# Patient Record
Sex: Female | Born: 1935 | Race: White | Hispanic: No | Marital: Married
Health system: Southern US, Community
[De-identification: ages and names within clinical notes are randomized; demographics above are authoritative.]

---

## 2004-11-25 ENCOUNTER — Ambulatory Visit: Payer: Self-pay | Admitting: Internal Medicine

## 2006-07-15 ENCOUNTER — Ambulatory Visit: Payer: Self-pay | Admitting: Internal Medicine

## 2007-09-27 ENCOUNTER — Ambulatory Visit: Payer: Self-pay | Admitting: Internal Medicine

## 2008-09-20 ENCOUNTER — Ambulatory Visit: Payer: Self-pay | Admitting: Internal Medicine

## 2010-05-08 ENCOUNTER — Emergency Department: Payer: Self-pay | Admitting: Emergency Medicine

## 2010-10-01 ENCOUNTER — Ambulatory Visit: Payer: Self-pay | Admitting: Internal Medicine

## 2010-10-13 ENCOUNTER — Ambulatory Visit: Payer: Self-pay | Admitting: Internal Medicine

## 2011-08-20 ENCOUNTER — Ambulatory Visit: Payer: Self-pay | Admitting: Urology

## 2011-08-31 ENCOUNTER — Ambulatory Visit: Payer: Self-pay | Admitting: Internal Medicine

## 2011-08-31 LAB — COMPREHENSIVE METABOLIC PANEL
Albumin: 3.5 g/dL (ref 3.4–5.0)
BUN: 16 mg/dL (ref 7–18)
Bilirubin,Total: 0.2 mg/dL (ref 0.2–1.0)
Co2: 31 mmol/L (ref 21–32)
Creatinine: 1.2 mg/dL (ref 0.60–1.30)
Glucose: 99 mg/dL (ref 65–99)
Potassium: 4.4 mmol/L (ref 3.5–5.1)
SGOT(AST): 17 U/L (ref 15–37)
SGPT (ALT): 21 U/L
Total Protein: 8.6 g/dL — ABNORMAL HIGH (ref 6.4–8.2)

## 2011-08-31 LAB — CBC CANCER CENTER
Basophil #: 0 x10 3/mm (ref 0.0–0.1)
Basophil %: 0.3 %
Eosinophil #: 0.7 x10 3/mm (ref 0.0–0.7)
Eosinophil %: 8.1 %
HCT: 43.4 % (ref 35.0–47.0)
Lymphocyte #: 1.1 x10 3/mm (ref 1.0–3.6)
Lymphocyte %: 13.3 %
MCH: 26.8 pg (ref 26.0–34.0)
MCHC: 32.7 g/dL (ref 32.0–36.0)
MCV: 82 fL (ref 80–100)
Monocyte %: 6.5 %
Neutrophil #: 6 x10 3/mm (ref 1.4–6.5)
Neutrophil %: 71.8 %
RBC: 5.29 10*6/uL — ABNORMAL HIGH (ref 3.80–5.20)

## 2011-08-31 LAB — APTT: Activated PTT: 36.6 secs — ABNORMAL HIGH (ref 23.6–35.9)

## 2011-08-31 LAB — PROTIME-INR
INR: 0.8
Prothrombin Time: 11.7 secs (ref 11.5–14.7)

## 2011-09-03 ENCOUNTER — Ambulatory Visit: Payer: Self-pay | Admitting: Internal Medicine

## 2011-09-09 ENCOUNTER — Ambulatory Visit: Payer: Self-pay | Admitting: Surgery

## 2011-09-14 ENCOUNTER — Ambulatory Visit: Payer: Self-pay

## 2011-09-18 ENCOUNTER — Ambulatory Visit: Payer: Self-pay | Admitting: Internal Medicine

## 2011-09-21 ENCOUNTER — Ambulatory Visit: Payer: Self-pay | Admitting: Internal Medicine

## 2011-10-06 LAB — CBC CANCER CENTER
Bands: 3 %
Basophil #: 0 x10 3/mm (ref 0.0–0.1)
Eosinophil #: 0.4 x10 3/mm (ref 0.0–0.7)
HCT: 39.4 % (ref 35.0–47.0)
HGB: 12.9 g/dL (ref 12.0–16.0)
Lymphocyte %: 9.9 %
Lymphocytes: 9 %
MCHC: 32.7 g/dL (ref 32.0–36.0)
MCV: 82 fL (ref 80–100)
Metamyelocyte: 1 %
Monocyte %: 7.9 %
Monocytes: 7 %
Neutrophil %: 77.5 %
Platelet: 331 x10 3/mm (ref 150–440)
RDW: 18.4 % — ABNORMAL HIGH (ref 11.5–14.5)
Segmented Neutrophils: 74 %

## 2011-10-06 LAB — RETICULOCYTES
Absolute Retic Count: 0.071 10*6/uL (ref 0.024–0.084)
Reticulocyte: 1.5 % (ref 0.5–1.5)

## 2011-10-10 ENCOUNTER — Inpatient Hospital Stay: Payer: Self-pay | Admitting: Internal Medicine

## 2011-10-10 LAB — CBC
HGB: 13.7 g/dL (ref 12.0–16.0)
MCH: 27.1 pg (ref 26.0–34.0)
MCV: 82 fL (ref 80–100)
Platelet: 233 10*3/uL (ref 150–440)
RBC: 5.06 10*6/uL (ref 3.80–5.20)
WBC: 13.6 10*3/uL — ABNORMAL HIGH (ref 3.6–11.0)

## 2011-10-10 LAB — APTT: Activated PTT: 33.2 secs (ref 23.6–35.9)

## 2011-10-10 LAB — COMPREHENSIVE METABOLIC PANEL
Anion Gap: 16 (ref 7–16)
BUN: 38 mg/dL — ABNORMAL HIGH (ref 7–18)
Bilirubin,Total: 0.4 mg/dL (ref 0.2–1.0)
Chloride: 108 mmol/L — ABNORMAL HIGH (ref 98–107)
Creatinine: 1.35 mg/dL — ABNORMAL HIGH (ref 0.60–1.30)
EGFR (African American): 49 — ABNORMAL LOW
Osmolality: 296 (ref 275–301)
Potassium: 4.7 mmol/L (ref 3.5–5.1)
Sodium: 144 mmol/L (ref 136–145)
Total Protein: 8.5 g/dL — ABNORMAL HIGH (ref 6.4–8.2)

## 2011-10-10 LAB — PROTIME-INR: Prothrombin Time: 14.8 secs — ABNORMAL HIGH (ref 11.5–14.7)

## 2011-10-11 LAB — TSH: Thyroid Stimulating Horm: 0.187 u[IU]/mL — ABNORMAL LOW

## 2011-10-12 LAB — BASIC METABOLIC PANEL
BUN: 40 mg/dL — ABNORMAL HIGH (ref 7–18)
Chloride: 112 mmol/L — ABNORMAL HIGH (ref 98–107)
Co2: 21 mmol/L (ref 21–32)
Creatinine: 1.3 mg/dL (ref 0.60–1.30)
EGFR (African American): 51 — ABNORMAL LOW
EGFR (Non-African Amer.): 42 — ABNORMAL LOW
Potassium: 4.5 mmol/L (ref 3.5–5.1)
Sodium: 142 mmol/L (ref 136–145)

## 2011-10-13 LAB — CBC WITH DIFFERENTIAL/PLATELET
Basophil #: 0 10*3/uL (ref 0.0–0.1)
Basophil %: 0 %
HCT: 37.4 % (ref 35.0–47.0)
HGB: 11.9 g/dL — ABNORMAL LOW (ref 12.0–16.0)
Lymphocyte %: 4.2 %
MCH: 26.4 pg (ref 26.0–34.0)
MCHC: 31.8 g/dL — ABNORMAL LOW (ref 32.0–36.0)
MCV: 83 fL (ref 80–100)
Monocyte %: 6.4 %
Neutrophil %: 87.7 %
Platelet: 235 10*3/uL (ref 150–440)
RBC: 4.5 10*6/uL (ref 3.80–5.20)
RDW: 19.2 % — ABNORMAL HIGH (ref 11.5–14.5)

## 2011-10-13 LAB — BASIC METABOLIC PANEL
Anion Gap: 14 (ref 7–16)
BUN: 41 mg/dL — ABNORMAL HIGH (ref 7–18)
Calcium, Total: 10.6 mg/dL — ABNORMAL HIGH (ref 8.5–10.1)
Chloride: 114 mmol/L — ABNORMAL HIGH (ref 98–107)
Co2: 17 mmol/L — ABNORMAL LOW (ref 21–32)
Creatinine: 1.17 mg/dL (ref 0.60–1.30)
EGFR (African American): 57 — ABNORMAL LOW
EGFR (Non-African Amer.): 47 — ABNORMAL LOW
Glucose: 104 mg/dL — ABNORMAL HIGH (ref 65–99)
Osmolality: 299 (ref 275–301)
Sodium: 145 mmol/L (ref 136–145)

## 2011-10-14 LAB — BASIC METABOLIC PANEL
BUN: 39 mg/dL — ABNORMAL HIGH (ref 7–18)
Calcium, Total: 10.1 mg/dL (ref 8.5–10.1)
Co2: 17 mmol/L — ABNORMAL LOW (ref 21–32)
Creatinine: 1.1 mg/dL (ref 0.60–1.30)
EGFR (African American): >60
Glucose: 85 mg/dL (ref 65–99)
Potassium: 4.4 mmol/L (ref 3.5–5.1)
Sodium: 141 mmol/L (ref 136–145)

## 2011-10-15 LAB — BASIC METABOLIC PANEL
Anion Gap: 12 (ref 7–16)
Calcium, Total: 9.2 mg/dL (ref 8.5–10.1)
Co2: 19 mmol/L — ABNORMAL LOW (ref 21–32)
Creatinine: 1.08 mg/dL (ref 0.60–1.30)
EGFR (Non-African Amer.): 52 — ABNORMAL LOW
Glucose: 86 mg/dL (ref 65–99)
Osmolality: 291 (ref 275–301)
Potassium: 4.2 mmol/L (ref 3.5–5.1)
Sodium: 142 mmol/L (ref 136–145)

## 2011-10-19 ENCOUNTER — Ambulatory Visit: Payer: Self-pay | Admitting: Internal Medicine

## 2011-10-19 LAB — PROT IMMUNOELECT,UR-24HR

## 2011-10-19 LAB — PROT IMMUNOELECTROPHORES(ARMC)

## 2011-10-26 LAB — PROTIME-INR: INR: 1.5

## 2011-10-29 LAB — PROTIME-INR
INR: 2.3
Prothrombin Time: 25.7 secs — ABNORMAL HIGH (ref 11.5–14.7)

## 2011-11-02 LAB — PROTIME-INR: Prothrombin Time: 40.3 secs — ABNORMAL HIGH (ref 11.5–14.7)

## 2011-11-09 LAB — PROTIME-INR: Prothrombin Time: 20.7 secs — ABNORMAL HIGH (ref 11.5–14.7)

## 2011-11-10 LAB — CBC CANCER CENTER
Basophil #: 0.1 x10 3/mm (ref 0.0–0.1)
Eosinophil %: 4.6 %
HCT: 32.7 % — ABNORMAL LOW (ref 35.0–47.0)
Lymphocyte %: 6 %
MCV: 79 fL — ABNORMAL LOW (ref 80–100)
Platelet: 460 x10 3/mm — ABNORMAL HIGH (ref 150–440)
RBC: 4.12 10*6/uL (ref 3.80–5.20)

## 2011-11-10 LAB — BASIC METABOLIC PANEL
Anion Gap: 16 (ref 7–16)
BUN: 23 mg/dL — ABNORMAL HIGH (ref 7–18)
Chloride: 105 mmol/L (ref 98–107)
Creatinine: 1.85 mg/dL — ABNORMAL HIGH (ref 0.60–1.30)
EGFR (African American): 29 — ABNORMAL LOW
EGFR (Non-African Amer.): 25 — ABNORMAL LOW
Glucose: 124 mg/dL — ABNORMAL HIGH (ref 65–99)
Osmolality: 286 (ref 275–301)
Potassium: 4.4 mmol/L (ref 3.5–5.1)
Sodium: 141 mmol/L (ref 136–145)

## 2011-11-10 LAB — PROTIME-INR
INR: 1.6
Prothrombin Time: 19.4 secs — ABNORMAL HIGH (ref 11.5–14.7)

## 2011-11-12 LAB — PROTIME-INR: INR: 1.4

## 2011-11-16 LAB — PROTIME-INR: Prothrombin Time: 20.4 secs — ABNORMAL HIGH (ref 11.5–14.7)

## 2011-11-18 ENCOUNTER — Ambulatory Visit: Payer: Self-pay | Admitting: Internal Medicine

## 2012-01-18 DEATH — deceased

## 2013-10-27 IMAGING — NM NUCLEAR MEDICINE CARDIAC MULTIPLE UPTAKE GATED ACQUISITION SCAN
4 series · 24 of 24 positions shown · non-contrast
Comparison: None

REASON FOR EXAM: LVEF function for pre chemo and lymphoma
COMMENTS:

PROCEDURE:     KNM - KNM REST MUGA SCAN [DATE] OF [DATE]  - [DATE] [DATE] [DATE] [DATE]
RESULT:     History: Lymphoma
TECHNIQUE: Patient's RBCs were labeled in vitro with 24.49 mCi of Tc 99m
pertechnetate using the standard kit. The patient's RBCs were then injected
and gated images in the LAO projection were obtained of the heart at rest.
Gated equilibrium data was used to calculate the left ventricular ejection
fraction. Images were evaluated in cine mode.

[Series 1000: lao 45-gated · 3.30mm/px · 6 of 24 frames shown]
[frame 3/24]
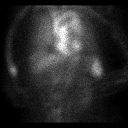
[frame 7/24]
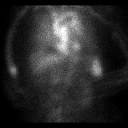
[frame 11/24]
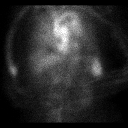
[frame 15/24]
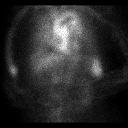
[frame 19/24]
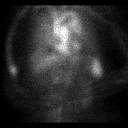
[frame 23/24]
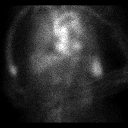

[Series 1000: lao 70-gated · 3.30mm/px · 6 of 24 frames shown]
[frame 3/24]
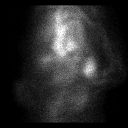
[frame 7/24]
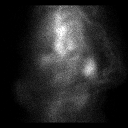
[frame 11/24]
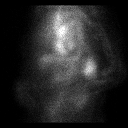
[frame 15/24]
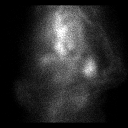
[frame 19/24]
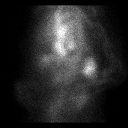
[frame 23/24]
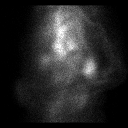

[Series 1000: lao 45-gated (results) · 3.30mm/px · 6 of 24 frames shown]
[frame 3/24]
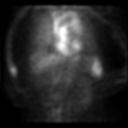
[frame 7/24]
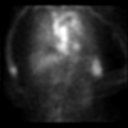
[frame 11/24]
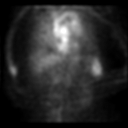
[frame 15/24]
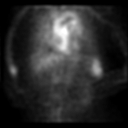
[frame 19/24]
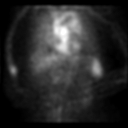
[frame 23/24]
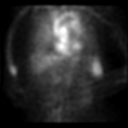

[Series 1000: ant-gated · 3.30mm/px · 6 of 24 frames shown]
[frame 3/24]
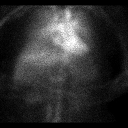
[frame 7/24]
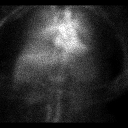
[frame 11/24]
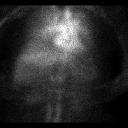
[frame 15/24]
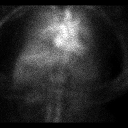
[frame 19/24]
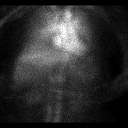
[frame 23/24]
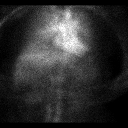

[24 of 24 positions shown; findings below may reference images not displayed]

FINDINGS: Calculated left ventricular ejection fraction is 64%. There is no regional
wall motion abnormality seen on the cine mode images.
IMPRESSION: 1. Left ventricular ejection fraction of 64%.

2. Normal cardiac chamber size and wall motion.

## 2013-10-30 IMAGING — US US EXTREM LOW VENOUS*L*
1 series · 17 of 23 positions shown · non-contrast
Comparison: none

REASON FOR EXAM: left leg swelling, cancer patient
COMMENTS:

PROCEDURE:     US  - US DOPPLER LOW EXTR LEFT  - October 10, 2011  [DATE]
RESULT:     There is occlusive thrombus in the common femoral vein,
superficial femoral vein, and the popliteal veins. There is some thrombus
noted also in the greater saphenous vein.

[Series 1: us extrem low venous*left* · 17 of 23 slices shown]
[im 1/23]
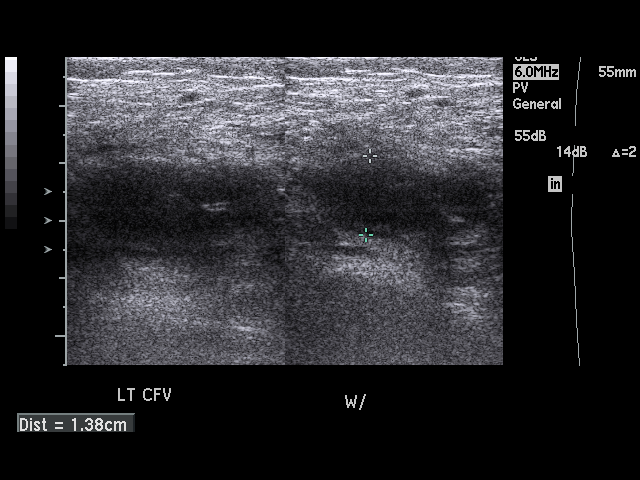
[im 3/23]
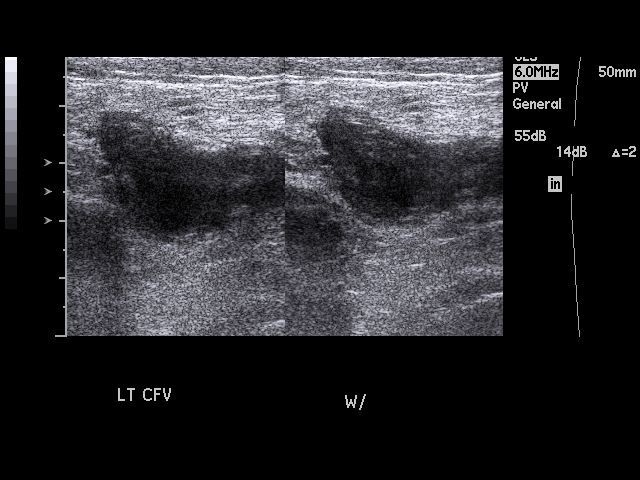
[im 4/23]
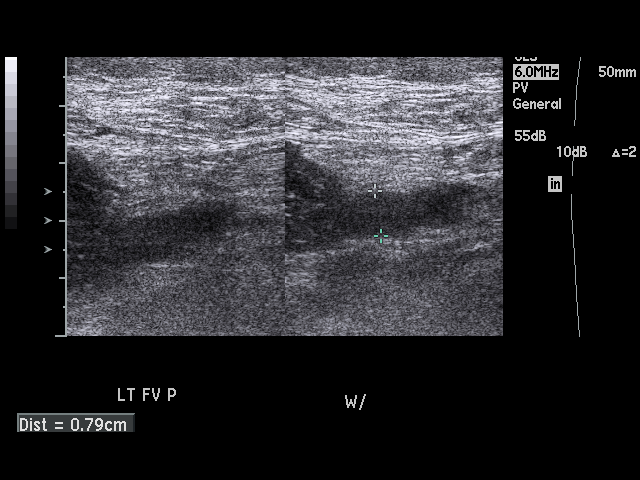
[im 5/23]
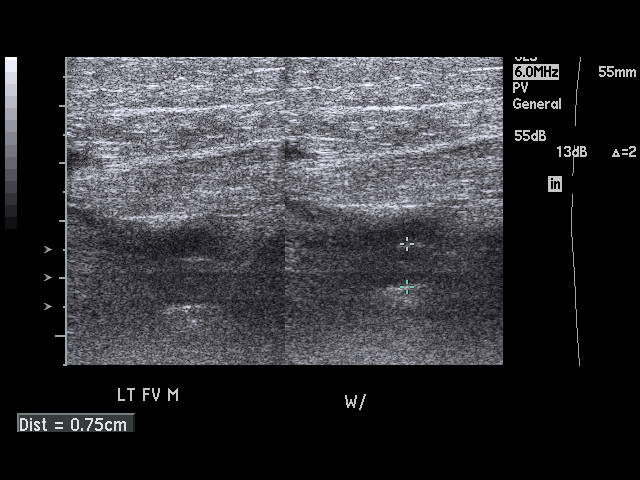
[im 7/23]
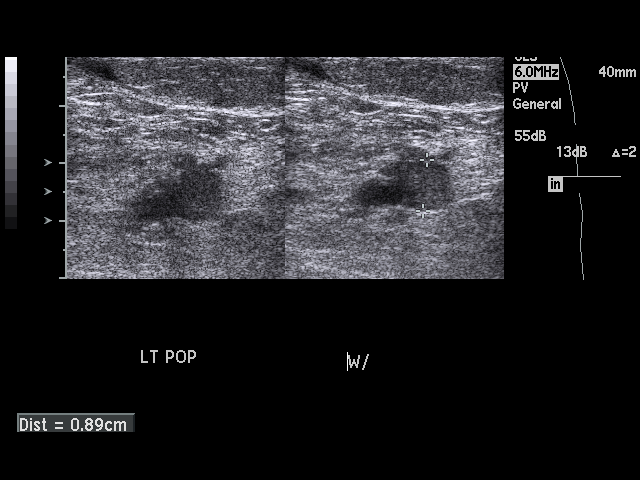
[im 8/23]
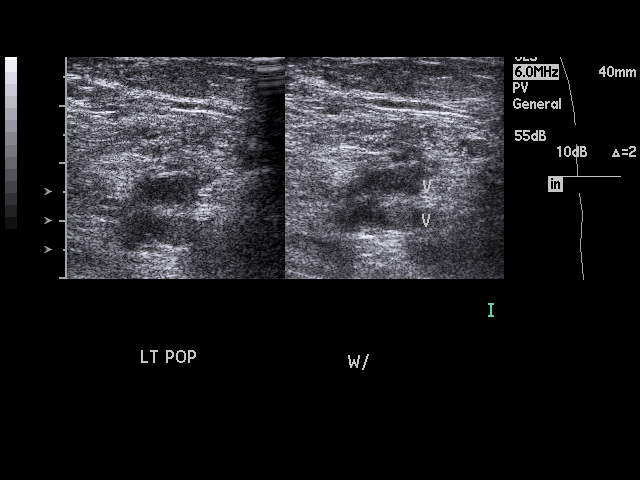
[im 9/23]
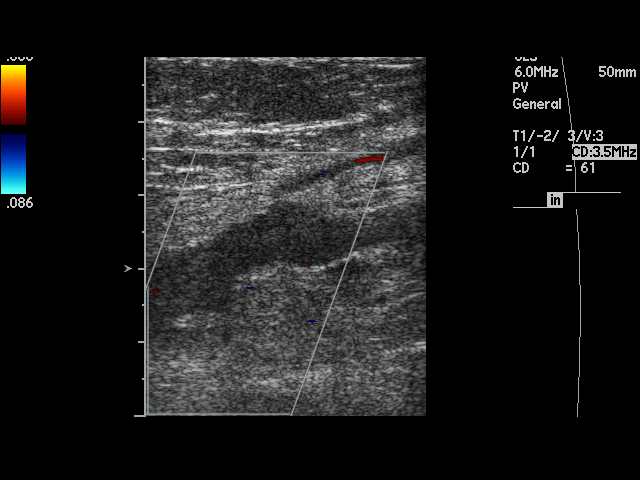
[im 11/23]
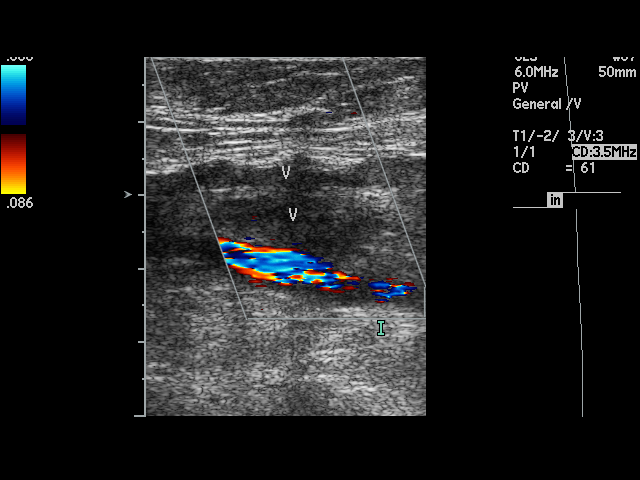
[im 12/23]
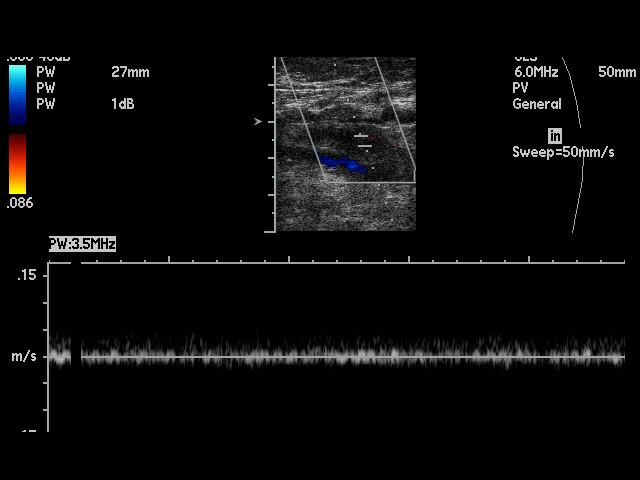
[im 13/23]
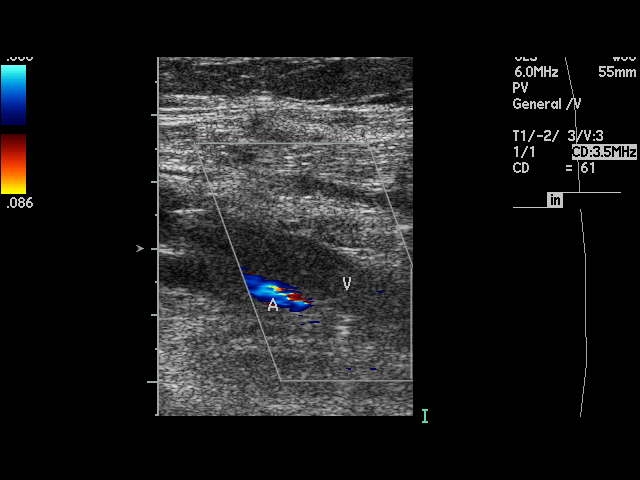
[im 15/23]
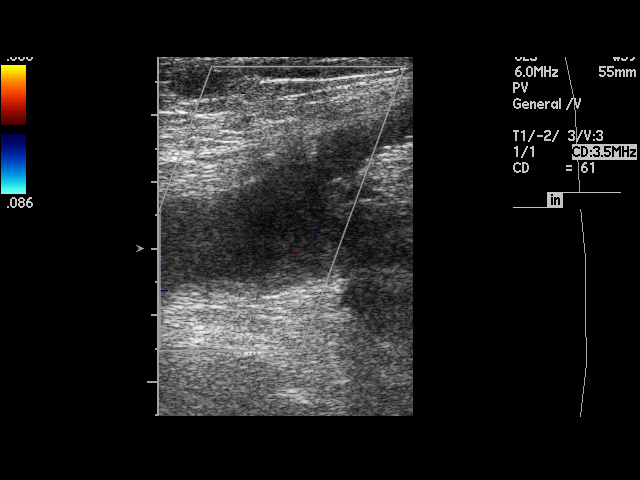
[im 16/23]
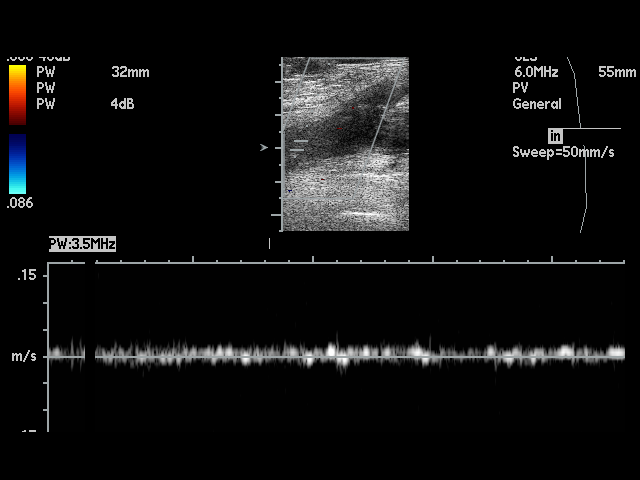
[im 17/23]
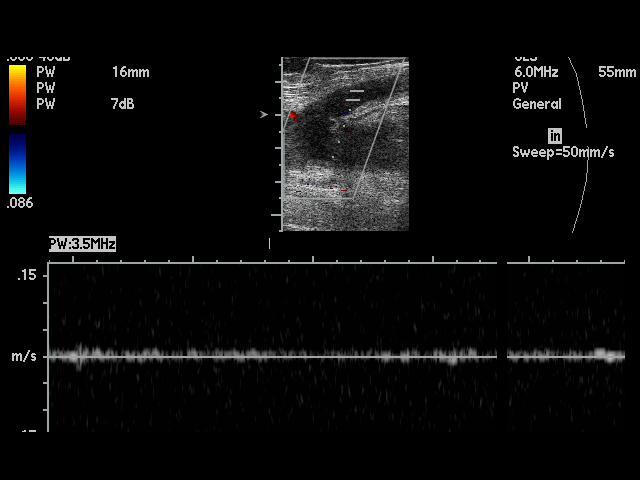
[im 19/23]
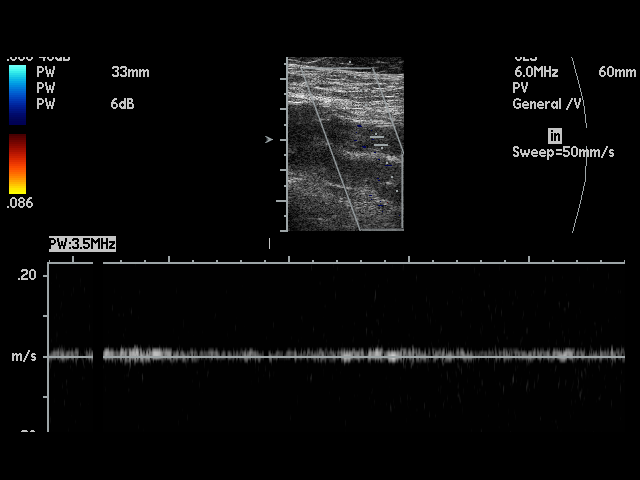
[im 20/23]
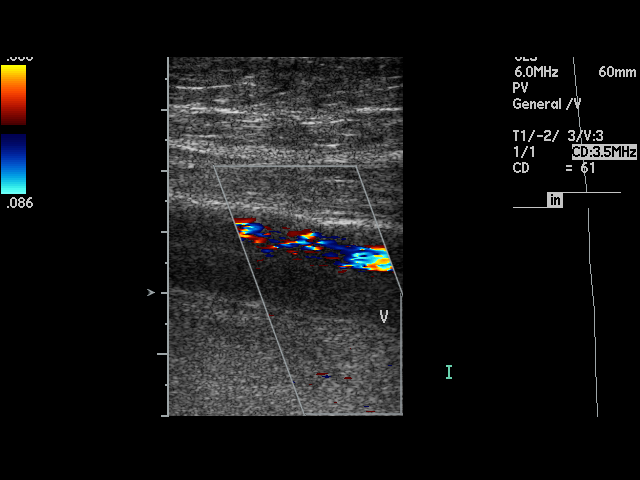
[im 21/23]
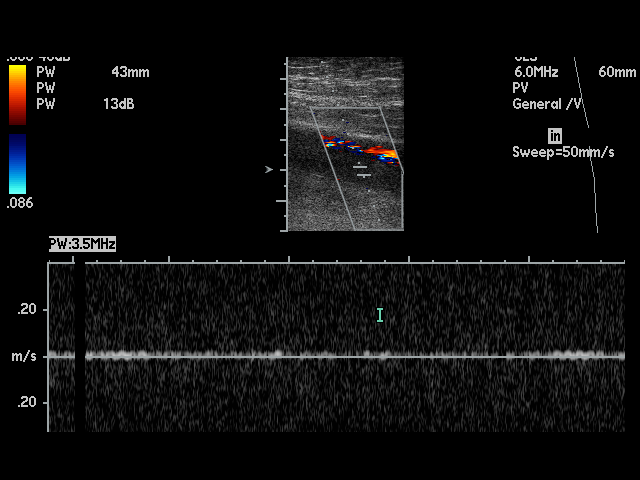
[im 23/23]
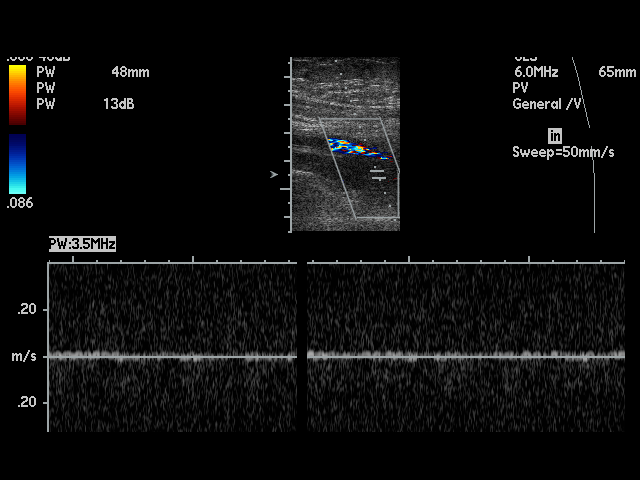

[17 of 23 positions shown; findings below may reference images not displayed]

IMPRESSION: There is acute occlusive thrombus within the left common
femoral, superficial femoral, and popliteal veins. There is a small amount
in the greater saphenous vein as well.

## 2013-11-01 IMAGING — CR METASTATIC BONE SURVEY
1 series · 8 of 8 positions shown · non-contrast
Comparison: none

REASON FOR EXAM: myeloma, eval for lytic bone lesions
COMMENTS:

[Series 1: w chest pa · 0.14mm/px · 8 of 23 slices shown]
[im 1/23]
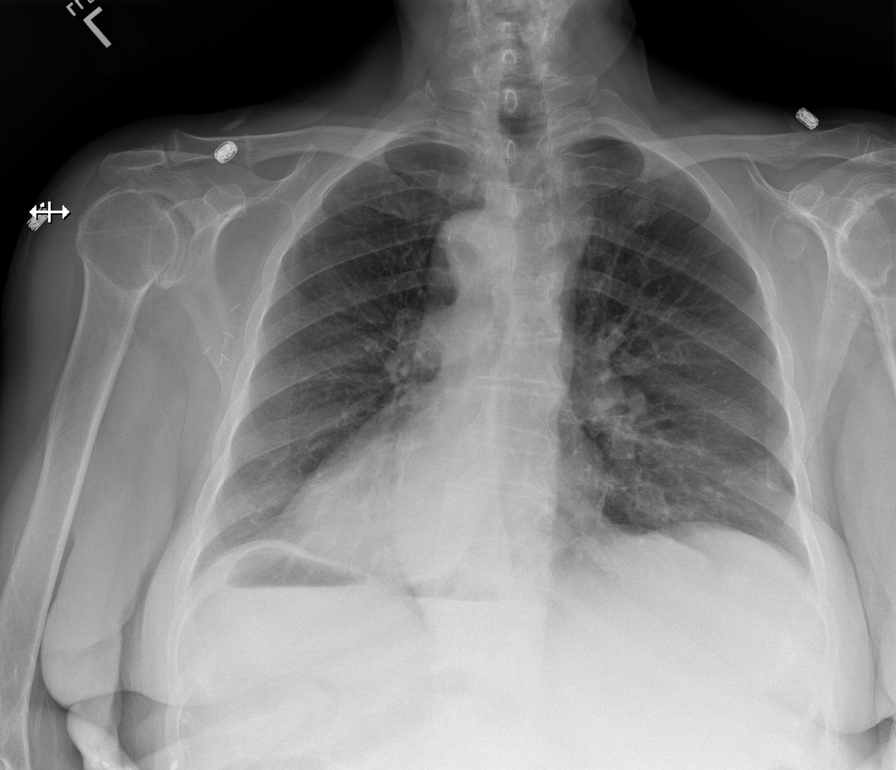
[im 4/23]
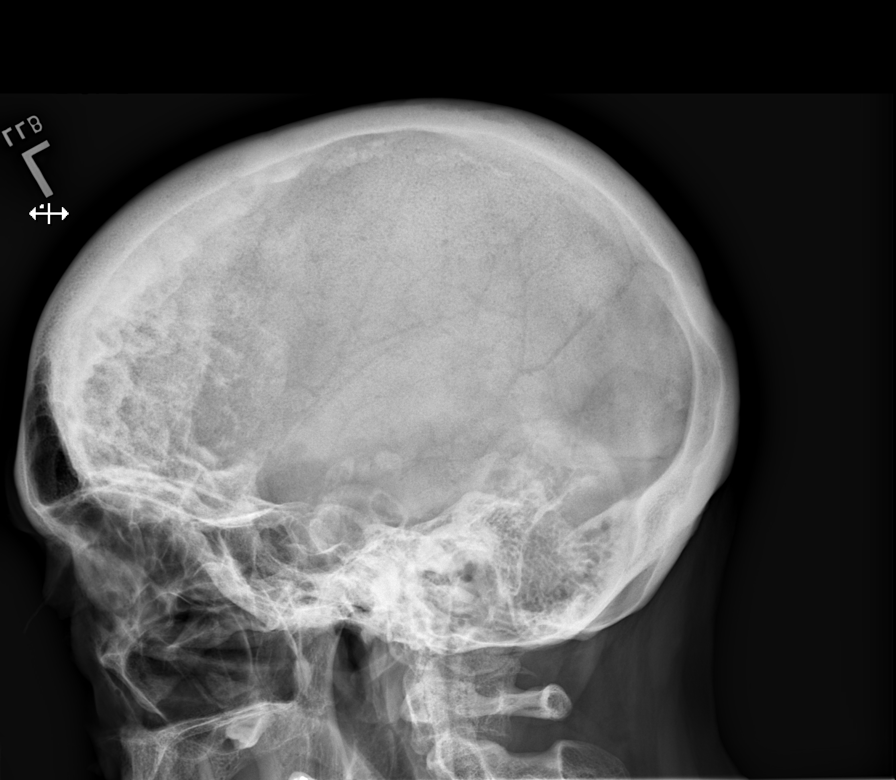
[im 7/23]
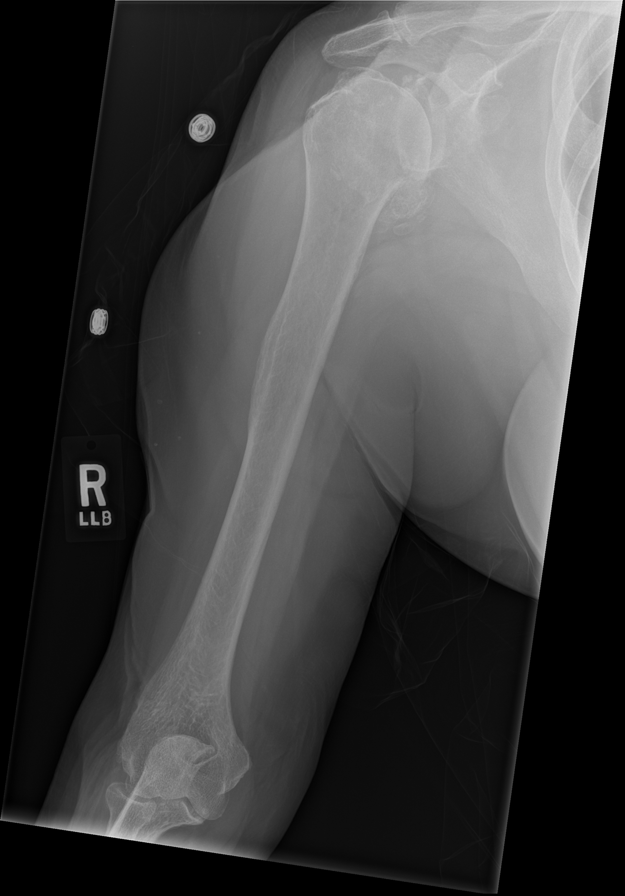
[im 10/23]
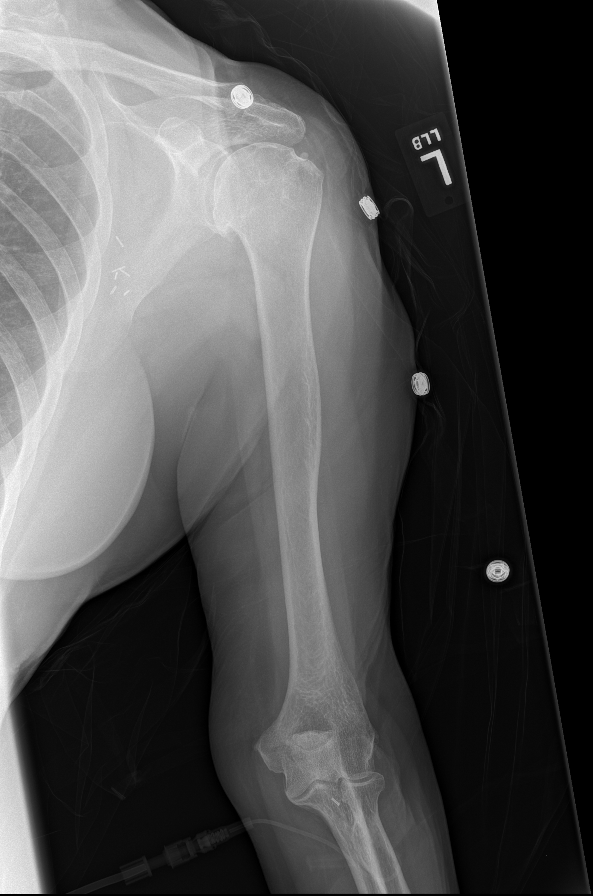
[im 13/23]
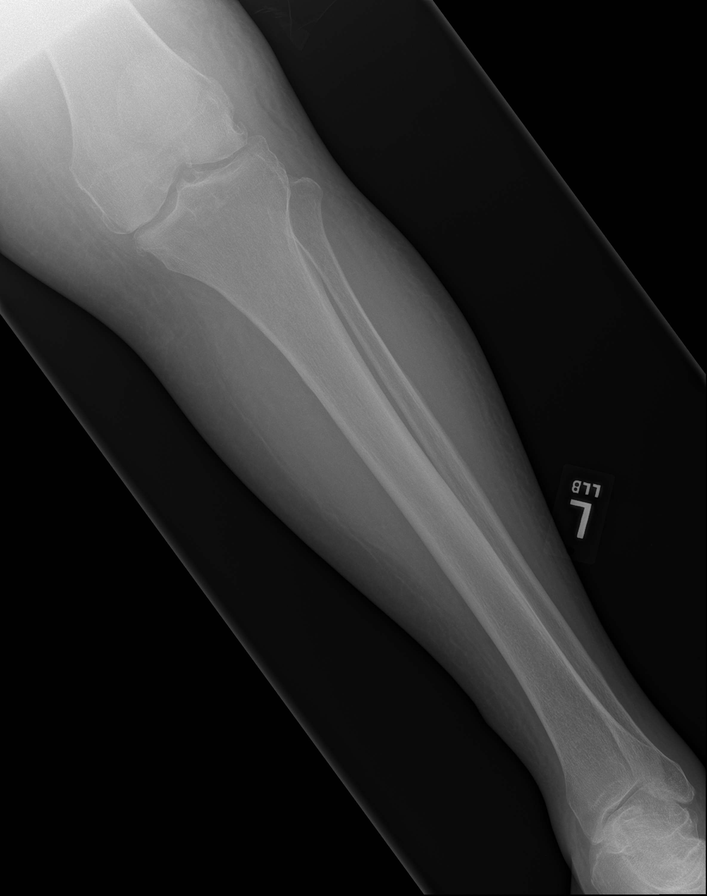
[im 16/23]
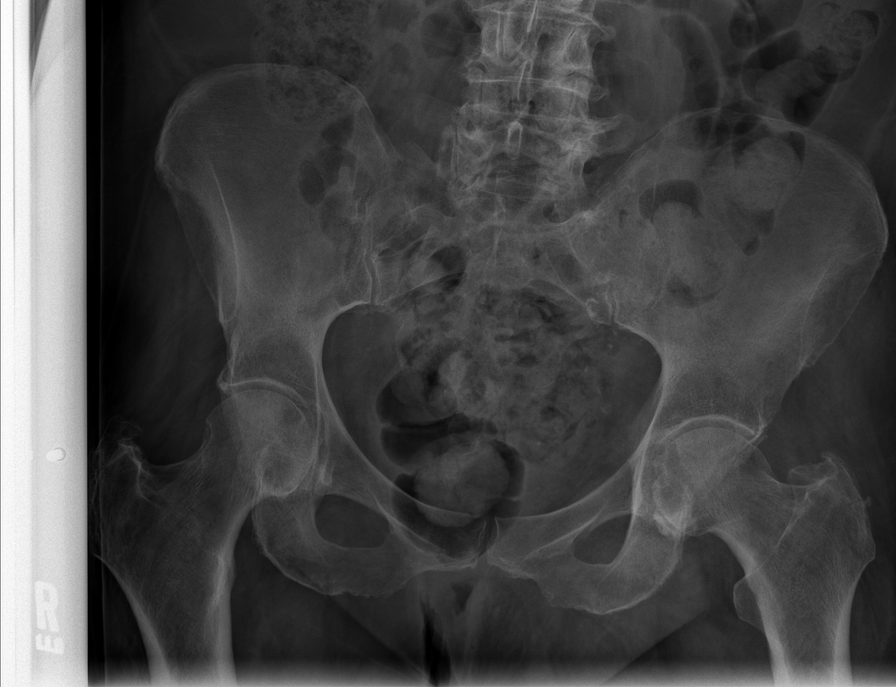
[im 19/23]
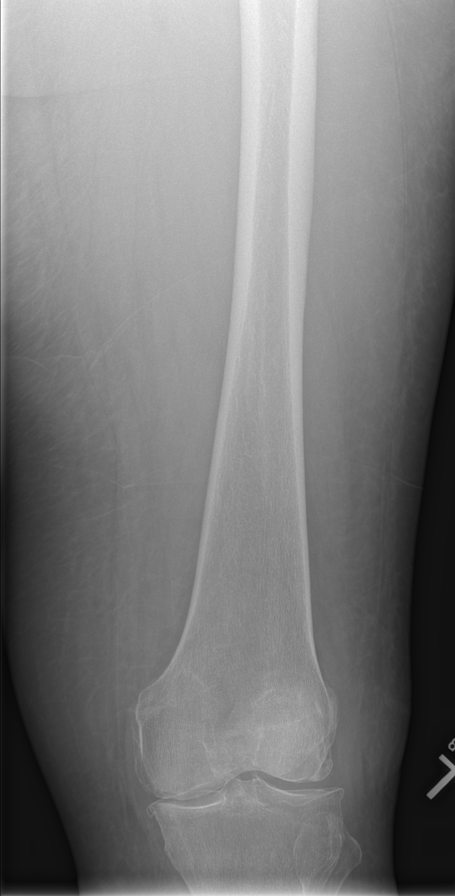
[im 23/23]
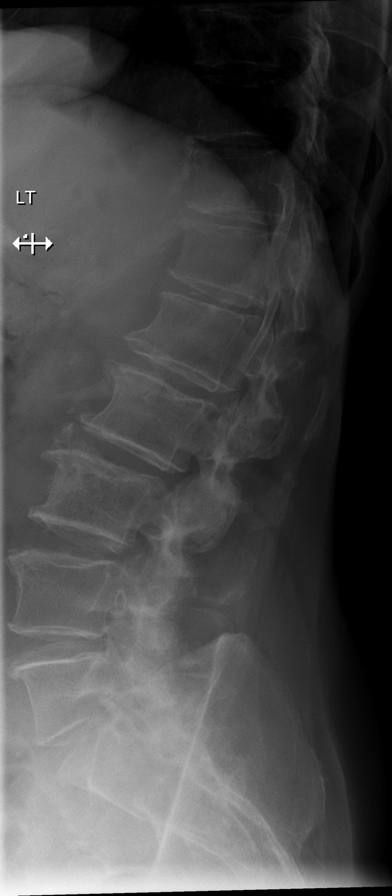

[8 of 8 positions shown; findings below may reference images not displayed]

PROCEDURE:     DXR - DXR BONE SURVEY METASTATIC  - October 12, 2011  [DATE]

RESULT:     No lytic or blastic lesions suspicious for metastatic disease
are identified. There are degenerative changes of the cervical spine. There
are noted mild, old-appearing vertebral compression deformities at
approximately T7 and T8.
IMPRESSION: 1.     No lytic or blastic lesions are identified.

## 2014-11-11 NOTE — Consult Note (Signed)
ONCOLOGY followup note - resting in bed, no new complaints. No new pains.no fevers. No bleeding Sxs.alert, NAD.            vitals - afebrile, stable           lungs - b/l good air entry           abd - soft, NTCr 1.08, calcium 9.2  Impression/Recommendations:81 year old female patient with multiple medical problems, recently diagnosed with stage III classical Hodgkin's disease alongwith concomitant diagnosis of multiple myeloma (bone marrow biopsy done for staging of Hodgkins disease incidentally found 50% monoclonal plasma cells consistent with multiple myeloma). Hypercalcemia secondary to malignancy, calcium and Creatinine doing well after IV hydration and Zometa 3 mg IV 3/26. Skeletal X-ray negative for lytic lesions. SIEP, 24-hour UIEP and PFT report pending. At this time, the patient and her cildren (son and 2 daughters) have decided not to put her through chemotherapy or cancer treatments, and want supportive care only including continuing zometa for control of hypercalcemia. She is being discharged back to [place] with [facility]. Newly diagnosed LLE DVT - patient being discharged on Xarelto. f/u at cancer center in about 3-4 weeks and plan next dose of Zometa. The patient and daughter present are agreeable to this plan.  Electronic Signatures: Jonn Shingles (MD)  (Signed on 29-Mar-13 00:02)  Authored  Last Updated: 29-Mar-13 00:02 by Jonn Shingles (MD)

## 2014-11-11 NOTE — Consult Note (Signed)
ONCOLOGY followup - resting in bed, weakness same. No new pains.no fevers. No bleeding Sxs.alert, NAD.            vitals - afebrile, stable           lungs - b/l good air entry           abd - soft, NTCr 1.10, calcium 10.1  Impression/Recommendations:81 year old female patient with multiple medical problems who has been recently diagnosed with stage III classical Hodgkin's disease and also bone marrow biopsy incidentally found 50% monoclonal plasma cells consistent with multiple myeloma. Hypercalcemia secondary to malignancy, calcium has now normalised after IV hydration and Zometa 3 mg IV yesterday. Skeletal X-ray negative for lytic lesions. SIEP, 24-hour UIEP and PFT report pending. At this time, the patient's daughter states that she has d/w her sister and brother and they have decided not to put her trough chemotherapy or cancer treatments, and want supportive care only including continuing zometa for control of hypercalcemia. They will d/w care management and then decide if they want to continue assisted living with [facility] or pursue outpatient hospice.Newly diagnosed LLE DVT - on Lovenox, recommend switching to oral anticoagulant since no port placement is planned.   patient and daughter present are agreeable to this plan.  Electronic Signatures: Jonn Shingles (MD)  (Signed on 27-Mar-13 22:22)  Authored  Last Updated: 27-Mar-13 22:22 by Jonn Shingles (MD)

## 2014-11-11 NOTE — Consult Note (Signed)
PATIENT NAME:  Karen Watkins, Karen Watkins MR#:  [id number] DATE OF BIRTH:  1929-02-27  DATE OF CONSULTATION:  10/11/2011  REFERRING PHYSICIAN:  Dr. Ouida Sills CONSULTING PHYSICIAN:  Tiphanie Vo R. Ma Hillock, MD  REASON FOR CONSULTATION: Recently diagnosed stage III Hodgkin's disease along with concurrently diagnosed multiple myeloma on bone marrow biopsy, admitted with left lower extremity deep venous thrombosis.   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 More recently the patient was found to have retroperitoneal adenopathy and PET scan also showed left axillary adenopathy. She had a lymph node biopsy, which was classical Hodgkin's lymphoma. Bone marrow biopsy done for staging of Hodgkin's disease showed 50% monoclonal plasma cells preliminarily consistent with diagnosis of multiple myeloma also. The patient has been undergoing work-up and also family is trying to decide (two daughters and son) if they should put her through chemotherapy or active cancer treatment for supportive care. In the meantime, patient was admitted to the hospital yesterday with progressive left lower extremity swelling and significant pain. Venous Doppler showed occlusive clot in the left lower extremity (including common femoral, superficial femoral, and popliteal veins). The patient is currently on Lovenox and is being planned for Xarelto. As an outpatient, she had been planned to visit with Dr. Genevive Bi tomorrow for planning Port-A-Cath placement. Currently she is resting in bed and states that she is fairly comfortable when she does not move or try to walk on the left leg. Daughter at bedside helps the patient provide history given her poor memory and dementia  symptoms. Otherwise oral intake is fairly steady. She has chronic fatigue and weakness which are unchanged. No fevers or night sweats.   PAST MEDICAL HISTORY/ PAST SURGICAL HISTORY: As in history of present illness above.   FAMILY HISTORY: Remarkable for lung cancer and breast cancer in her sisters.   SOCIAL HISTORY: Ex-smoker, quit in 1992. No history of alcohol usage. Lives at [facility].   ALLERGIES: No known drug allergies.   HOME MEDICATIONS:  1. Cardizem ER 180 mg daily.  2. Paroxetine 10 mg daily.  3. Levothyroxine 100 mcg daily.  4. HCTZ 12.5 mg daily.  5. Aspirin 325 mg daily. 6. B12 1000-mcg injection monthly.  7. Vitamin D 400 units 2-1/2 tablets daily.  8. Selenium shampoo as needed for dandruff.  9. Trazodone 50 mg at bedtime.  10. Lorazepam 0.5 mg q. 4 hours p.r.n. for anxiety.  11. Naproxen 220 mg q. 8 hours p.r.n. for pain.   REVIEW OF SYSTEMS: CONSTITUTIONAL: Generalized weakness and fatigue. Recent unintentional 15 to 20-pound weight loss. Currently no fevers, chills, or night sweats. HEENT: Denies any headaches, dizziness, epistaxis, ear or jaw pain. CARDIAC: No new angina, palpitations, orthopnea, or paroxysmal nocturnal dyspnea. LUNGS: Mild dyspnea on physical activity. No cough, sputum, hemoptysis, or chest pain. GASTROINTESTINAL: Denies any nausea, vomiting, or diarrhea. No bright red blood in stools or melena. GENITOURINARY: No dysuria or hematuria. MUSCULOSKELETAL: Denies any new bone pain, chronic arthritis unchanged. HEMATOLOGIC: No obvious bleeding symptoms. SKIN: No new rashes or pruritus. EXTREMITIES:  Left leg swelling and pain secondary to recent deep vein thrombosis. NEUROLOGIC: Has chronic memory changes/dementia symptoms. No new focal weakness, seizures, or loss of consciousness. ENDOCRINE: No polyuria or polydipsia. Appetite is fair.   PHYSICAL EXAMINATION: GENERAL:  GENERAL:  The patient is elderly,  resting in bed, moderately built and  nourished individual in no acute distress. Answers simple questions appropriately. No icterus. No pallor.   VITAL SIGNS: Temperature 98.3, pulse 118, respiratory rate 20, blood pressure 127/81, 96% on room air.   HEENT: Normocephalic, atraumatic. Extraocular movements intact. Sclerae anicteric. No oral thrush.   NECK: Supple without lymphadenopathy.   CARDIOVASCULAR: S1, S2, regular rate and rhythm.   LUNGS: Lungs show bilateral good air entry. No crepitations or rhonchi noted.   ABDOMEN: Soft, nontender. No hepatosplenomegaly.   EXTREMITIES: Left lower extremity edema, trace right leg edema. No cyanosis.   LYMPHATICS: Left axillary lymph node palpable on deep palpation.   NEUROLOGIC: Cranial nerves are intact, nonfocal. Gait not checked.   MUSCULOSKELETAL: No obvious joint deformity or swelling.   LABORATORY, DIAGNOSTIC, AND RADIOLOGICAL DATA: WBC 13,600, hemoglobin 13.7, platelets 233, MCV 82, creatinine 1.35, potassium 4.7, calcium 10.8. Liver functions unremarkable except albumin of 3.3.   Chest x-ray shows changes consistent with chronic obstructive pulmonary disease. No congestive heart failure or pneumonia. INR 1.1, PTT 33.2.   IMPRESSION AND RECOMMENDATION: This is an 81 year old female patient with multiple medical problems as described above with borderline performance status (ECOG 2) who has been recently diagnosed with stage III classical Hodgkin's disease and also bone marrow biopsy incidentally found 50% monoclonal plasma cells, preliminarily consistent with multiple myeloma. The patient has been undergoing work-up and treatment planning as an outpatient and was planned for a visit with surgeon Dr. Genevive Bi tomorrow for Port-A-Cath placement. At this time, the patient's two daughters state that they have decided to wait for her son to return from [place], and the three of them are going to get together along with the patient and then decide if they want to put her through cancer  treatment versus considering supportive care/Hospice. Her son is expected on Wednesday, March 27. Until then I recommend keeping the patient on Lovenox anticoagulation for left lower extremity deep vein thrombosis and hold off on starting Xarelto. The patient can be discharged tomorrow if she continues to do well without any new complications or acute issues on Lovenox and will be followed in the [place] closely as an outpatient. Also she now has developed hypercalcemia. I agree with IV hydration today. Once adequately hydrated tomorrow we will likely give her bisphosphonate therapy with low dose Zometa of 3 mg IV (calculated creatinine clearance is around 35 mL per minute). The patient and daughter present at bedside were again explained that Hodgkin's disease is a very treatable and possibly curable malignancy, and also multiple myeloma is usually slower growing and numerous treatment options are available to try and control myeloma also that she may be able to tolerate. I also explained that we could start her at a lower dose chemotherapy dose escalate if she tolerates it after the first or second cycles. They understand the details explained and will think about this. No other acute pain issues at this time. I have encouraged the patient to try and eat better also. The patient and daughter present are agreeable to this plan.   Thank you for the referral. Please feel free to contact me if additional questions.    ____________________________ Rhett Bannister Ma Hillock, MD srp:[id number] D: 10/11/2011 21:28:45 ET T: 10/12/2011 06:12:31 ET JOB#: [id number]  cc: Trevor Iha R. Ma Hillock, MD, <Dictator> Alveta Heimlich MD ELECTRONICALLY SIGNED 10/12/2011 13:01

## 2014-11-11 NOTE — H&P (Signed)
PATIENT NAME:  Karen Watkins, Karen Watkins MR#:  [id number] DATE OF BIRTH:  [birth year]  DATE OF ADMISSION:  10/10/2011  CHIEF COMPLAINT: Left lower extremity swelling.    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 She was brought to the Emergency Room today with a 48-hour history of left lower extremity swelling with ultrasound confirming an acute DVT occluding the common femoral, superficial femoral, and popliteal veins. There is some clot nonocclusive also noted in the greater saphenous vein. The patient's family provides the history as she has no short-term memory. According to the family who was very involved in her care, she started complaining of swelling yesterday and when they saw the leg today it was red and intensely swollen and they brought her to the ED. Again, the patient underwent a bone marrow biopsy on Tuesday of this week by Dr. Ma Hillock which confirmed diagnosis of multiple myeloma with over 50% of her cells transformed.   PRIMARY CARE PHYSICIAN: Dr. Frazier Richards   PAST MEDICAL HISTORY:  1. History of [place] Spotted Fever causing encephalitis with complete loss of short-term memory.  2. Hypothyroidism.  3. Hypertension. 4. Hyperlipidemia.  5. Recurrent UTIs.  6. Depression.  7. Insomnia.   MEDICATIONS: 1. Diltiazem ER 180 mg daily. 2. Paroxetine 10 mg daily.  3. Levothyroxine 100 mcg daily. 4. Hydrochlorothiazide 12.5 mg daily.  5. Aspirin 325 mg daily.  6. Vitamin D 400 units 2-1/2 tabs daily.  7. Selenium 2.5% lotion shampoo as needed for dandruff. 8. B12 1000 mcg injection monthly IM. 9. Trazodone 50 mg at bedtime as needed  10. Lorazepam 0.5 mg every four hours as needed  for anxiety. 11. Naproxen 220 mg tablet every eight hours as needed for pain. 12. Fluocinonide 0.05% 8 to 10 drops to scalp at bedtime as needed.   PAST SURGICAL HISTORY: Notable only for hysterectomy remotely.   ALLERGIES: She has no known drug allergies.   LAST HOSPITALIZATION: She has no history of prior hospitalizations other than the 2006 hospitalization for [facility] Spotted Fever encephalitis.    FAMILY HISTORY: Notable for a non-tobacco related lung cancer in a sister and breast cancer in another sister. There are no bone marrow malignancies in the family.   SOCIAL HISTORY: She is an ex-smoker having quit in 1992. She is a nondrinker. She lives at [facility].   REVIEW OF SYSTEMS: Positive for weight loss of 20 pounds over the last year. No recent visual changes. No history of seasonal rhinitis, difficulty swallowing, or snoring. No history of cough, wheezing, hemoptysis, or dyspnea. No history of chest pain, orthopnea, or dyspnea on exertion. She does have a history of lower extremity edema per history of present illness. No recent nausea, vomiting, or diarrhea but the patient has had anorexia. Occasional constipation. She does have a history of recurrent urinary tract infections. She has no history of polyuria or nocturia. She does have a history of hypothyroidism. No history of anemia, easy bruising or bleeding. No history of rashes other than dandruff. She has no history of chronic arthritis type of pain. She does have memory loss secondary to prior encephalitis. She does have a history of insomnia and anxiety.   PHYSICAL EXAMINATION:   GENERAL: This  is a well developed, well nourished elderly female in no apparent distress.    ORIGINAL VITAL SIGNS: Temperature 98.1, pulse 78, respirations 18, blood pressure 134/78, room air sats 100%.   HEENT: Pupils are equal, round, and reactive to light. Extraocular movements are intact. Sclerae are nonicteric. Hearing is  intact. She has several teeth that are missing on the lower mandible. No pharyngeal erythema.   NECK: Supple without lymphadenopathy, JVD, thyromegaly, or carotid bruits.   LUNGS: Clear to auscultation bilaterally.   CARDIOVASCULAR: Tachycardic but regular. Left extremity is cool to the touch and edematous and has a mottled appearance.   ABDOMEN: Soft, nontender, and nondistended with good bowel sounds and no hepatosplenomegaly.   SKIN: Skin is warm and dry without rashes or lesions.   MUSCULOSKELETAL: Strength is not tested in lower extremities due to acute clot. She is moving all extremities without problems.   LYMPH: There is no cervical, inguinal, or supraclavicular lymphadenopathy. She does have axillary lymphadenopathy.   NEUROLOGICAL: Cranial nerves are intact. Deep tendon reflexes are intact. Sensation is intact.    PSYCHOLOGIC: She is alert but not oriented to place or time. She is cooperative. Again, memory is impaired due to prior encephalitis for short-term and interim memories.   ADMISSION LABORATORY DATA: Sodium 144, potassium 4.7, chloride 108, bicarb 20, BUN 38, creatinine 1.35. Liver function tests total protein 8.5, albumin 3.3, total bilirubin 0.4, calcium elevated at 10.8, AST 19, ALT 20. PT/INR 14.8 and 1.1 respectively. PTT 33.2. White count 13.6, hemoglobin 13.7, platelets 233, MCV 82. Ultrasound of the left lower extremity acute occlusive thrombus within the left common femoral, superficial femoral, and popliteal veins with a small amount in the greater saphenous vein as well.   ASSESSMENT AND PLAN:  1. Acute DVT secondary to underlying malignancy. Will start on subcutaneous Lovenox and Coumadin bridge will be started tomorrow. Have discussed the risks with family of starting Coumadin therapy specifically related to her recent bone marrow biopsy. They are willing to proceed.  2. Hodgkin's lymphoma/multiple myeloma. This is a brand new diagnosis for the patient and her  family is undecided about whether to pursue treatment given her age and her underlying dementia. They are concerned about impacting her quality of life in a way that she will not understand. I have discussed this with them and recommended that they speak with Dr. Izora Gala Phifer, Palliative Care consultant, about goals of care and palliative care. Consult has been ordered.  3. History of hypertension. The patient takes diltiazem and hydrochlorothiazide at home. Will resume diltiazem without hydrochlorothiazide given her creatinine. Baseline creatinine is not known.  4. Dementia disorder with insomnia. Continue trazodone and p.r.n. lorazepam for any agitation.  5. Hypothyroidism. Will check TSH in the morning and continue levothyroxine 100 mcg daily for now.   ESTIMATED TIME OF CARE INCLUDING TIME SPENT WITH FAMILY MEMBERS: 60 minutes.   ____________________________ Deborra Medina, MD tt:drc D: 10/10/2011 21:29:13 ET T: 10/11/2011 08:55:47 ET JOB#: [id number]  cc: Deborra Medina, MD, <Dictator> Ocie Cornfield. Ouida Sills, MD Deborra Medina MD ELECTRONICALLY SIGNED 10/12/2011 5:57

## 2014-11-11 NOTE — Discharge Summary (Signed)
PATIENT NAME:  Karen Watkins, Karen Watkins MR#:  [id number] DATE OF BIRTH:  08-24-1928  DATE OF ADMISSION:  10/10/2011 DATE OF DISCHARGE:  10/15/2011  DISCHARGE DIAGNOSES:  1. Deep venous thrombosis.  2. Recently diagnosed lymphoma.  3. Recently diagnosed multiple myeloma.  4. 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  5. Hypercalcemia, treated with Zometa.  6. Hypertension.  7. Depression.  8. Hypothyroid.   DISCHARGE MEDICATIONS:  1. Xarelto 15 mg a day for deep venous thrombosis, which is dosed renally.  2. Buspirone 5 mg, 1.5 b.i.d.  3. B12 IM monthly. 4. Vitamin D 1000 international units daily.  5. Levothyroxine 100 mcg daily. 6. Nystatin powder p.r.n.   7. Fluocinonide solution p.r.n.  8. Paroxetine 10 mg daily.  9. Trazodone 50 mg daily.  10.  Macrobid 25 mg in a.m., which has been given by Urology previously.  11.  Norco p.r.n. pain.    NOTE: She will not take naproxen, aspirin, or Megace, which she previously took for reasons.   HISTORY AND PHYSICAL: Please see detailed History and Physical done on admission. Briefly, she presented with left leg pain and swelling for several days. Recent bone marrow with the above findings.   HOSPITAL COURSE: The patient was admitted with deep venous thrombosis, given Lovenox subcutaneously. She did well with this. Multiple discussions were had with Oncology, Palliative Care and the family about the treatment course regarding her lymphoma and myeloma. This took several days. In the meantime, her calcium was treated and her leg swelling improved. The patient intermittently was going to work with Physical Therapy, did walk 100 foot or more when she did. She is eating a little but not a lot at this point. At this point she will be discharged back to her assisted living facility, and Home Health had been following along. She did not wish any further treatment with  chemotherapy after discussion with her and her family given the overall situation. We will have Home Health follow her for now unless Hospice can be arranged. There is a question of whether this can be done at her particular facility. Her bone scan was notably negative. Her creatinine was 1.08 today with a GFR of 52, which seemed to be around her normal range. Sometimes this is around 40 or so as well.   TIME SPENT:   It took approximately 35 minutes to do discharge tasks.  ____________________________ Ocie Cornfield. Ouida Sills, MD [id number]:[id number] D: 10/15/2011 08:42:50 ET T: 10/15/2011 14:14:05 ET JOB#: [id number]  cc: Ocie Cornfield. Ouida Sills, MD, <Dictator> Kirk Ruths MD ELECTRONICALLY SIGNED 10/20/2011 13:43

## 2014-11-11 NOTE — Consult Note (Signed)
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  1) DVT: Lovenox discussed with family POA .  risks of bleeding given recent BM biopsy discussed.  2) HL/MM: family is ambivalent about treating with chemotherapy given patients age,  memory impairment and concerns about QOL.  Dr. Ma Hillock is her oncologist.  Discussed alternative of palliative care and offereed consult with Dr. Ermalinda Memos which was accepted .   3) ARF:  baseline Cr unknown. Given history of 20 lb wt loss over the past years,  will hold HCTZ, hydrate for presumed decreased oral intake and follow cr.   4) HTN:  continue Diltiazem, patient tachycardic tonight without it.   4) Dementia:  secondary to RMSF encephalitis which occurred several years ago, resulting in complete loss of short term memory.  Patient requires Assisted living and is currently a resident of [facility].   5) Hypothyroidism:  continue Synthroid   Electronic Signatures: Deborra Medina (MD)  (Signed 23-Mar-13 21:34)  Authored: Chief Complaint, Assessment/Plan   Last Updated: 23-Mar-13 21:34 by Deborra Medina (MD)

## 2014-11-11 NOTE — Op Note (Signed)
PATIENT NAME:  Karen CairoBOOKER, Trany W MR#:  [id number] DATE OF BIRTH:  [birth year]  DATE OF PROCEDURE:  09/14/2011  PREOPERATIVE DIAGNOSIS: Left axillary lymphadenopathy.   POSTOPERATIVE DIAGNOSIS: Left axillary lymphadenopathy.  OPERATION PERFORMED: Left axillary node biopsy.   SURGEON: Claude MangesWilliam F. Marterre, MD   ASSISTANT: Sheppard Plumberimothy E. Noeh Sparacino, MD   INDICATIONS FOR PROCEDURE: Ms. Grace BushyBooker is an 81 year old female with a history of recurrent urinary tract infections for which she received a CT scan as part of her diagnostic work-up. She was found to have extensive retroperitoneal adenopathy, and a PET scan confirmed the presence of extensive retroperitoneal and axillary lymphadenopathy. The concern was that this may represent a lymphoma. She was offered the above-named procedure for definitive diagnosis.   DESCRIPTION OF PROCEDURE: The patient was brought to the Operating Suite and placed in the supine position. General anesthesia was given with a laryngeal mask airway. The patient was prepped and draped in the usual sterile fashion. A skin incision was made in line with the breast crease. The incision was deepened down through the fatty tissues until the pectoralis major muscle was identified. This was retracted superiorly. We could palpate the mass within the left axilla. An Allis clamp was placed on this lymph node mass, and it was extracted from the axilla. Small clips were placed on small blood vessels and lymph vessels. The mass was excised. There was a second lymph node adjacent to it. The first measured approximately 2 cm, and the second measured about 1 cm. Both of these were removed and sent to pathology. The axilla was then closed after ensuring hemostasis. Subcutaneous tissues were closed with running Vicryl sutures, and the skin was also closed with a running Vicryl suture.       The patient tolerated the procedure well and was taken over to the recovery room in stable condition after being  extubated.  ____________________________ Sheppard Plumberimothy E. Thelma Bargeaks, MD teo:cbb D: 09/14/2011 15:14:37 ET T: 09/14/2011 15:54:27 ET JOB#: [id number]  cc: Marcial Pacasimothy E. Thelma Bargeaks, MD, <Dictator> Sandeep R. Sherrlyn HockPandit, MD Jasmine DecemberIMOTHY E Akiva Brassfield MD ELECTRONICALLY SIGNED 09/18/2011 4:24

## 2014-11-11 NOTE — Consult Note (Signed)
ONCOLOGY followup note - overall doing steady, weakness same. No bone pains.no fevers. No bleeding Sxs.alert, NAD.            vitals - afebrile, stable           lungs - b/l good air entry           abd - soft, NTCr 1.17, calcium 10.6, Hb 11.9  Impression/Recommendations:81 year old female patient with multiple medical problems who has been recently diagnosed with stage III classical Hodgkin's disease and also bone marrow biopsy incidentally found 50% monoclonal plasma cells consistent with multiple myeloma. Hypercalcemia secondary to malignancy, calcium still elevated despite IV hydration, will give Zometa 3 mg IV today. Skeletal xray negative for lytic lesions, will also get SIEP and 24-hour UIEP. PFT report pending. At this time, the patient's two daughters state that they have decided to wait for her son to return from [place], and the three of them are going to get together along with the patient and then decide if they want to put her through cancer treatment versus considering supportive care/Hospice. Her son is expected on Wednesday, March 27. Newly diagnosed LLE DVT - continue on Lovenox anticoagulation till decision is made regarding chemotherapy since she would need port placement.  patient and daughter present are agreeable to this plan.   Electronic Signatures: Jonn Shingles (MD)  (Signed on 26-Mar-13 22:55)  Authored  Last Updated: 26-Mar-13 22:55 by Jonn Shingles (MD)
